# Patient Record
Sex: Male | Born: 1985 | Race: Black or African American | Hispanic: No | Marital: Single | Smoking: Never smoker
Health system: Southern US, Community
[De-identification: ages and names within clinical notes are randomized; demographics above are authoritative.]

## PROBLEM LIST (undated history)

## (undated) HISTORY — PX: APPENDECTOMY: SHX54

## (undated) HISTORY — PX: CHOLECYSTECTOMY: SHX55

---

## 2014-12-12 ENCOUNTER — Encounter (HOSPITAL_BASED_OUTPATIENT_CLINIC_OR_DEPARTMENT_OTHER): Payer: Self-pay | Admitting: Emergency Medicine

## 2014-12-12 ENCOUNTER — Emergency Department (HOSPITAL_BASED_OUTPATIENT_CLINIC_OR_DEPARTMENT_OTHER)
Admission: EM | Admit: 2014-12-12 | Discharge: 2014-12-12 | Disposition: A | Discharge status: Home / Self Care | Payer: Self-pay | Attending: Emergency Medicine | Admitting: Emergency Medicine

## 2014-12-12 ENCOUNTER — Emergency Department (HOSPITAL_BASED_OUTPATIENT_CLINIC_OR_DEPARTMENT_OTHER): Payer: Self-pay

## 2014-12-12 DIAGNOSIS — N23 Unspecified renal colic: Secondary | ICD-10-CM | POA: Insufficient documentation

## 2014-12-12 LAB — CBC WITH DIFFERENTIAL/PLATELET
BASOS ABS: 0 10*3/uL (ref 0.0–0.1)
Basophils Relative: 1 % (ref 0–1)
EOS PCT: 2 % (ref 0–5)
Eosinophils Absolute: 0.1 10*3/uL (ref 0.0–0.7)
HEMATOCRIT: 45.5 % (ref 39.0–52.0)
HEMOGLOBIN: 15.1 g/dL (ref 13.0–17.0)
Lymphocytes Relative: 38 % (ref 12–46)
Lymphs Abs: 1.6 10*3/uL (ref 0.7–4.0)
MCH: 28.2 pg (ref 26.0–34.0)
MCHC: 33.2 g/dL (ref 30.0–36.0)
MCV: 84.9 fL (ref 78.0–100.0)
Monocytes Absolute: 0.4 10*3/uL (ref 0.1–1.0)
Monocytes Relative: 10 % (ref 3–12)
NEUTROS ABS: 2.1 10*3/uL (ref 1.7–7.7)
Neutrophils Relative %: 49 % (ref 43–77)
Platelets: 254 10*3/uL (ref 150–400)
RBC: 5.36 MIL/uL (ref 4.22–5.81)
RDW: 12.3 % (ref 11.5–15.5)
WBC: 4.3 10*3/uL (ref 4.0–10.5)

## 2014-12-12 LAB — URINALYSIS, ROUTINE W REFLEX MICROSCOPIC
Bilirubin Urine: NEGATIVE
Glucose, UA: NEGATIVE mg/dL
Ketones, ur: NEGATIVE mg/dL
Leukocytes, UA: NEGATIVE
NITRITE: NEGATIVE
Protein, ur: NEGATIVE mg/dL
SPECIFIC GRAVITY, URINE: 1.025 (ref 1.005–1.030)
Urobilinogen, UA: 0.2 mg/dL (ref 0.0–1.0)
pH: 6 (ref 5.0–8.0)

## 2014-12-12 LAB — BASIC METABOLIC PANEL
Anion gap: 8 (ref 5–15)
BUN: 17 mg/dL (ref 6–20)
CO2: 28 mmol/L (ref 22–32)
CREATININE: 1.22 mg/dL (ref 0.61–1.24)
Calcium: 9.4 mg/dL (ref 8.9–10.3)
Chloride: 104 mmol/L (ref 101–111)
GFR calc Af Amer: >60 mL/min (ref >60)
GFR calc non Af Amer: >60 mL/min (ref >60)
Glucose, Bld: 96 mg/dL (ref 65–99)
POTASSIUM: 4.3 mmol/L (ref 3.5–5.1)
Sodium: 140 mmol/L (ref 135–145)

## 2014-12-12 LAB — URINE MICROSCOPIC-ADD ON

## 2014-12-12 MED ORDER — KETOROLAC TROMETHAMINE 30 MG/ML IJ SOLN
30.0000 mg | Freq: Once | INTRAMUSCULAR | Status: AC
  Administered 2014-12-12: 30 mg via INTRAVENOUS
  Filled 2014-12-12: qty 1

## 2014-12-12 MED ORDER — OXYCODONE-ACETAMINOPHEN 5-325 MG PO TABS
1.0000 | ORAL_TABLET | Freq: Once | ORAL | Status: AC
  Administered 2014-12-12: 1 via ORAL
  Filled 2014-12-12: qty 1

## 2014-12-12 MED ORDER — SODIUM CHLORIDE 0.9 % IV BOLUS (SEPSIS)
1000.0000 mL | Freq: Once | INTRAVENOUS | Status: AC
  Administered 2014-12-12: 1000 mL via INTRAVENOUS

## 2014-12-12 MED ORDER — HYDROMORPHONE HCL 1 MG/ML IJ SOLN
1.0000 mg | Freq: Once | INTRAMUSCULAR | Status: AC
  Administered 2014-12-12: 1 mg via INTRAVENOUS
  Filled 2014-12-12: qty 1

## 2014-12-12 MED ORDER — OXYCODONE-ACETAMINOPHEN 5-325 MG PO TABS: 1.0000 | ORAL_TABLET | Freq: Four times a day (QID) | ORAL | Status: DC | PRN

## 2014-12-12 MED ORDER — SODIUM CHLORIDE 0.9 % IV SOLN
Freq: Once | INTRAVENOUS | Status: AC
  Administered 2014-12-12: 1000 mL via INTRAVENOUS

## 2014-12-12 MED ORDER — ONDANSETRON HCL 4 MG/2ML IJ SOLN
4.0000 mg | Freq: Once | INTRAMUSCULAR | Status: AC
  Administered 2014-12-12: 4 mg via INTRAVENOUS
  Filled 2014-12-12: qty 2

## 2014-12-12 NOTE — ED Provider Notes (Signed)
CSN: [id number]     Arrival date & time 12/12/14  0750 History   First MD Initiated Contact with Patient 12/12/14 0759     No chief complaint on file.    (Consider location/radiation/quality/duration/timing/severity/associated sxs/prior Treatment) Patient is a 29 y.o. male presenting with abdominal pain.  Abdominal Pain Pain location:  R flank and RLQ Pain quality: aching and sharp   Pain radiates to:  Does not radiate Pain severity:  Severe Onset quality:  Gradual Duration:  4 days Timing:  Constant Progression:  Worsening Chronicity:  Recurrent Context comment:  During urination  Relieved by:  Nothing Worsened by:  Urination Associated symptoms: chills, dysuria, nausea and vomiting   Associated symptoms: no hematuria   Associated symptoms comment:  Urinary hesitancy.    No past medical history on file. No past surgical history on file. No family history on file. Social History  Substance Use Topics  . Smoking status: Not on file  . Smokeless tobacco: Not on file  . Alcohol Use: Not on file    Review of Systems  Constitutional: Positive for chills.  Gastrointestinal: Positive for nausea, vomiting and abdominal pain.  Genitourinary: Positive for dysuria. Negative for hematuria.  All other systems reviewed and are negative.     Allergies  Review of patient's allergies indicates not on file.  Home Medications   Prior to Admission medications   Not on File   BP 145/86 mmHg  Pulse 78  Temp(Src) 98.1 F (36.7 C) (Oral)  Resp 16  SpO2 100% Physical Exam  Constitutional: He is oriented to person, place, and time. He appears well-developed and well-nourished. No distress.  HENT:  Head: Normocephalic and atraumatic.  Mouth/Throat: Oropharynx is clear and moist.  Eyes: Conjunctivae are normal. Pupils are equal, round, and reactive to light. No scleral icterus.  Neck: Neck supple.  Cardiovascular: Normal rate, regular rhythm, normal heart sounds and intact  distal pulses.   No murmur heard. Pulmonary/Chest: Effort normal and breath sounds normal. No stridor. No respiratory distress. He has no wheezes. He has no rales.  Abdominal: Soft. He exhibits no distension. There is tenderness in the right lower quadrant. There is CVA tenderness (right). There is no rigidity, no rebound and no guarding. Hernia confirmed negative in the right inguinal area and confirmed negative in the left inguinal area.  Previous appendectomy and cholecystectomy scars  Genitourinary: Penis normal. Right testis shows no mass and no tenderness. Left testis shows no mass and no tenderness. Circumcised. No discharge found.  Musculoskeletal: Normal range of motion. He exhibits no edema.  Neurological: He is alert and oriented to person, place, and time.  Skin: Skin is warm and dry. No rash noted.  Psychiatric: He has a normal mood and affect. His behavior is normal.  Nursing note and vitals reviewed.   ED Course  Procedures (including critical care time) Labs Review Labs Reviewed  URINALYSIS, ROUTINE W REFLEX MICROSCOPIC (NOT AT [facility]) - Abnormal; Notable for the following:    Hgb urine dipstick LARGE (*)    All other components within normal limits  URINE MICROSCOPIC-ADD ON - Abnormal; Notable for the following:    Bacteria, UA FEW (*)    All other components within normal limits  URINE CULTURE  CBC WITH DIFFERENTIAL/PLATELET  BASIC METABOLIC PANEL    Imaging Review US Renal  12/12/2014   CLINICAL DATA:  Right flank pain for 2 days. History of kidney stones.  EXAM: RENAL / URINARY TRACT ULTRASOUND COMPLETE  COMPARISON:  None.  FINDINGS: Right Kidney:  Length: 12.2 cm. No mass identified. Echogenic foci in measure 3 mm in the renal pelvis and 4 mm more laterally in the interpolar/upper pole in may reflect small stones. The renal pelvis/ UPJ measures 7 mm in diameter. There is no caliceal dilatation.  Left Kidney:  Length: 10.5 cm. Echogenicity within normal limits. No mass  or hydronephrosis visualized.  Bladder:  Appears normal for degree of bladder distention.  IMPRESSION: 1. Two small echogenic foci in the right kidney measuring 3-4 mm and possibly reflecting small stones. Minimal prominence of the right renal pelvis without hydronephrosis. 2. Unremarkable appearance of the left kidney.   Electronically Signed   By: Sebastian Ache   On: 12/12/2014 10:56     EKG Interpretation None      MDM   Final diagnoses:  Ureteral colic    Right flank pain.  History of kidney stones.  IV dilaudid and zofran for symptom control.  [place], UA, and blood work.   UA and ultrasound suggest ureteral stone. Will treat as such. Pain controlled. Advised urology follow-up.  Blake Divine, MD 12/12/14 [phone]

## 2014-12-12 NOTE — ED Notes (Signed)
Pain rt flank

## 2014-12-12 NOTE — Discharge Instructions (Signed)

## 2014-12-14 LAB — URINE CULTURE

## 2014-12-19 ENCOUNTER — Emergency Department (HOSPITAL_BASED_OUTPATIENT_CLINIC_OR_DEPARTMENT_OTHER)
Admission: EM | Admit: 2014-12-19 | Discharge: 2014-12-19 | Disposition: A | Discharge status: Home / Self Care | Payer: Self-pay | Attending: Emergency Medicine | Admitting: Emergency Medicine

## 2014-12-19 ENCOUNTER — Encounter (HOSPITAL_BASED_OUTPATIENT_CLINIC_OR_DEPARTMENT_OTHER): Payer: Self-pay | Admitting: Emergency Medicine

## 2014-12-19 DIAGNOSIS — Z9049 Acquired absence of other specified parts of digestive tract: Secondary | ICD-10-CM | POA: Insufficient documentation

## 2014-12-19 DIAGNOSIS — N23 Unspecified renal colic: Secondary | ICD-10-CM | POA: Insufficient documentation

## 2014-12-19 DIAGNOSIS — R110 Nausea: Secondary | ICD-10-CM | POA: Insufficient documentation

## 2014-12-19 LAB — URINALYSIS, ROUTINE W REFLEX MICROSCOPIC
BILIRUBIN URINE: NEGATIVE
Glucose, UA: NEGATIVE mg/dL
KETONES UR: NEGATIVE mg/dL
Leukocytes, UA: NEGATIVE
NITRITE: NEGATIVE
Protein, ur: NEGATIVE mg/dL
Specific Gravity, Urine: 1.021 (ref 1.005–1.030)
UROBILINOGEN UA: 0.2 mg/dL (ref 0.0–1.0)
pH: 7 (ref 5.0–8.0)

## 2014-12-19 LAB — BASIC METABOLIC PANEL
Anion gap: 12 (ref 5–15)
BUN: 12 mg/dL (ref 6–20)
CALCIUM: 9.8 mg/dL (ref 8.9–10.3)
CO2: 25 mmol/L (ref 22–32)
Chloride: 105 mmol/L (ref 101–111)
Creatinine, Ser: 1.06 mg/dL (ref 0.61–1.24)
GFR calc Af Amer: >60 mL/min (ref >60)
GFR calc non Af Amer: >60 mL/min (ref >60)
Glucose, Bld: 116 mg/dL — ABNORMAL HIGH (ref 65–99)
Potassium: 4.1 mmol/L (ref 3.5–5.1)
SODIUM: 142 mmol/L (ref 135–145)

## 2014-12-19 LAB — URINE MICROSCOPIC-ADD ON

## 2014-12-19 MED ORDER — PROMETHAZINE HCL 25 MG PO TABS: 25.0000 mg | ORAL_TABLET | Freq: Four times a day (QID) | ORAL | Status: AC | PRN

## 2014-12-19 MED ORDER — PROMETHAZINE HCL 25 MG/ML IJ SOLN
12.5000 mg | Freq: Once | INTRAMUSCULAR | Status: AC
  Administered 2014-12-19: 12.5 mg via INTRAVENOUS
  Filled 2014-12-19: qty 1

## 2014-12-19 MED ORDER — SODIUM CHLORIDE 0.9 % IV BOLUS (SEPSIS)
1000.0000 mL | Freq: Once | INTRAVENOUS | Status: AC
  Administered 2014-12-19: 1000 mL via INTRAVENOUS

## 2014-12-19 MED ORDER — OXYCODONE-ACETAMINOPHEN 5-325 MG PO TABS: 2.0000 | ORAL_TABLET | ORAL | Status: AC | PRN

## 2014-12-19 MED ORDER — TAMSULOSIN HCL 0.4 MG PO CAPS
0.4000 mg | ORAL_CAPSULE | Freq: Once | ORAL | Status: AC
  Administered 2014-12-19: 0.4 mg via ORAL
  Filled 2014-12-19: qty 1

## 2014-12-19 MED ORDER — HYDROMORPHONE HCL 1 MG/ML IJ SOLN
1.0000 mg | INTRAMUSCULAR | Status: AC | PRN
  Administered 2014-12-19 (×2): 1 mg via INTRAVENOUS
  Filled 2014-12-19 (×2): qty 1

## 2014-12-19 MED ORDER — TAMSULOSIN HCL 0.4 MG PO CAPS: 0.4000 mg | ORAL_CAPSULE | Freq: Every day | ORAL | Status: AC

## 2014-12-19 MED ORDER — SODIUM CHLORIDE 0.9 % IV SOLN
INTRAVENOUS | Status: DC
Start: 2014-12-19 — End: 2014-12-19
  Administered 2014-12-19: 500 mL via INTRAVENOUS

## 2014-12-19 NOTE — ED Provider Notes (Signed)
CSN: [id number]     Arrival date & time 12/19/14  4098 History   First MD Initiated Contact with Patient 12/19/14 [phone]     Chief Complaint  Patient presents with  . Groin Pain     HPI  Patient presents evaluation of right flank pain. Seen and evaluated here 7 days ago. Had an ultrasound showing right proximal dilatation of the collecting system and hematuria. Takewith ureteral colic. He was doing well. Has 1 day left of his pain medicine. Infection not take any yesterday. He waking this morning with recurrence of his pain is in a different location is right lower abdomen radiating to his right testicle and he presents here. Also states that a few episodes of emesis this morning as well.  No past medical history on file. Past Surgical History  Procedure Laterality Date  . Cholecystectomy    . Appendectomy     No family history on file. Social History  Substance Use Topics  . Smoking status: Never Smoker   . Smokeless tobacco: None  . Alcohol Use: None    Review of Systems  Constitutional: Negative for fever, chills, diaphoresis, appetite change and fatigue.  HENT: Negative for mouth sores, sore throat and trouble swallowing.   Eyes: Negative for visual disturbance.  Respiratory: Negative for cough, chest tightness, shortness of breath and wheezing.   Cardiovascular: Negative for chest pain.  Gastrointestinal: Positive for nausea and abdominal pain. Negative for vomiting, diarrhea and abdominal distention.  Endocrine: Negative for polydipsia, polyphagia and polyuria.  Genitourinary: Positive for testicular pain. Negative for dysuria, frequency and hematuria.  Musculoskeletal: Negative for gait problem.  Skin: Negative for color change, pallor and rash.  Neurological: Negative for dizziness, syncope, light-headedness and headaches.  Hematological: Does not bruise/bleed easily.  Psychiatric/Behavioral: Negative for behavioral problems and confusion.      Allergies  Morphine  and related; Toradol; and Zofran  Home Medications   Prior to Admission medications   Medication Sig Start Date End Date Taking? Authorizing Provider  oxyCODONE-acetaminophen (PERCOCET/ROXICET) 5-325 MG per tablet Take 2 tablets by mouth every 4 (four) hours as needed. 12/19/14   Rolland Porter, MD  promethazine (PHENERGAN) 25 MG tablet Take 1 tablet (25 mg total) by mouth every 6 (six) hours as needed for nausea or vomiting. 12/19/14   Rolland Porter, MD  tamsulosin (FLOMAX) 0.4 MG CAPS capsule Take 1 capsule (0.4 mg total) by mouth daily. 12/19/14   Rolland Porter, MD   BP 127/63 mmHg  Pulse 77  Temp(Src) 98.4 F (36.9 C) (Oral)  Resp 16  Ht  (1.905 m)  Wt 200 lb (90.719 kg)  BMI 25.00 kg/m2  SpO2 100% Physical Exam  Constitutional: He is oriented to person, place, and time. He appears well-developed and well-nourished. No distress.  HENT:  Head: Normocephalic.  Eyes: Conjunctivae are normal. Pupils are equal, round, and reactive to light. No scleral icterus.  Neck: Normal range of motion. Neck supple. No thyromegaly present.  Cardiovascular: Normal rate and regular rhythm.  Exam reveals no gallop and no friction rub.   No murmur heard. Pulmonary/Chest: Effort normal and breath sounds normal. No respiratory distress. He has no wheezes. He has no rales.  Abdominal: Soft. Bowel sounds are normal. He exhibits no distension. There is no tenderness. There is no rebound.    Genitourinary:     Musculoskeletal: Normal range of motion.  Neurological: He is alert and oriented to person, place, and time.  Skin: Skin is warm and dry. No  rash noted.  Psychiatric: He has a normal mood and affect. His behavior is normal.    ED Course  Procedures (including critical care time) Labs Review Labs Reviewed  URINALYSIS, ROUTINE W REFLEX MICROSCOPIC (NOT AT [facility]) - Abnormal; Notable for the following:    Hgb urine dipstick LARGE (*)    All other components within normal limits  BASIC METABOLIC  PANEL - Abnormal; Notable for the following:    Glucose, Bld 116 (*)    All other components within normal limits  URINE MICROSCOPIC-ADD ON - Abnormal; Notable for the following:    Bacteria, UA FEW (*)    All other components within normal limits    Imaging Review No results found. I have personally reviewed and evaluated these images and lab results as part of my medical decision-making.   EKG Interpretation None      MDM   Final diagnoses:  Ureteral colic   Patient hydrated. Given IV pain medication. Symptoms improved. Has persistent microscopic hematuria. Normal testicular exam. Think his symptoms are consistent with a migrating stone. We discussed this at length. We discuss pros and cons of imaging. I do not feel this would offer any additional pertinent information on him as his symptoms are getting well controlled. Given prescriptions. Stated that if the symptoms persisted for greater than one more week with Vicodin to see urology to discuss possibility of extraction. His renal function remains intact. Afebrile. Does not appear infected. Plan is expectant management, and symptom control.    Rolland Porter, MD 12/19/14 [phone]

## 2014-12-19 NOTE — Discharge Instructions (Signed)
Your urine sample still shows blood consistent with the continued kidney stone. Your kidney function(blood test) remains normal. If you have not passed the stone or had relief of pain within the next 7 days' please follow-up with [facility] to discuss possible extraction of the stone. Return here with intolerable pain, vomiting, fevers, or other new symptoms.   Kidney Stones Kidney stones (urolithiasis) are deposits that form inside your kidneys. The intense pain is caused by the stone moving through the urinary tract. When the stone moves, the ureter goes into spasm around the stone. The stone is usually passed in the urine.  CAUSES   A disorder that makes certain neck glands produce too much parathyroid hormone (primary hyperparathyroidism).  A buildup of uric acid crystals, similar to gout in your joints.  Narrowing (stricture) of the ureter.  A kidney obstruction present at birth (congenital obstruction).  Previous surgery on the kidney or ureters.  Numerous kidney infections. SYMPTOMS   Feeling sick to your stomach (nauseous).  Throwing up (vomiting).  Blood in the urine (hematuria).  Pain that usually spreads (radiates) to the groin.  Frequency or urgency of urination. DIAGNOSIS   Taking a history and physical exam.  Blood or urine tests.  CT scan.  Occasionally, an examination of the inside of the urinary bladder (cystoscopy) is performed. TREATMENT   Observation.  Increasing your fluid intake.  Extracorporeal shock wave lithotripsy--This is a noninvasive procedure that uses shock waves to break up kidney stones.  Surgery may be needed if you have severe pain or persistent obstruction. There are various surgical procedures. Most of the procedures are performed with the use of small instruments. Only small incisions are needed to accommodate these instruments, so recovery time is minimized. The size, location, and chemical composition are all important  variables that will determine the proper choice of action for you. Talk to your health care provider to better understand your situation so that you will minimize the risk of injury to yourself and your kidney.  HOME CARE INSTRUCTIONS   Drink enough water and fluids to keep your urine clear or pale yellow. This will help you to pass the stone or stone fragments.  Strain all urine through the provided strainer. Keep all particulate matter and stones for your health care provider to see. The stone causing the pain may be as small as a grain of salt. It is very important to use the strainer each and every time you pass your urine. The collection of your stone will allow your health care provider to analyze it and verify that a stone has actually passed. The stone analysis will often identify what you can do to reduce the incidence of recurrences.  Only take over-the-counter or prescription medicines for pain, discomfort, or fever as directed by your health care provider.  Make a follow-up appointment with your health care provider as directed.  Get follow-up X-rays if required. The absence of pain does not always mean that the stone has passed. It may have only stopped moving. If the urine remains completely obstructed, it can cause loss of kidney function or even complete destruction of the kidney. It is your responsibility to make sure X-rays and follow-ups are completed. Ultrasounds of the kidney can show blockages and the status of the kidney. Ultrasounds are not associated with any radiation and can be performed easily in a matter of minutes. SEEK MEDICAL CARE IF:  You experience pain that is progressive and unresponsive to any pain medicine you  have been prescribed. SEEK IMMEDIATE MEDICAL CARE IF:   Pain cannot be controlled with the prescribed medicine.  You have a fever or shaking chills.  The severity or intensity of pain increases over 18 hours and is not relieved by pain medicine.  You  develop a new onset of abdominal pain.  You feel faint or pass out.  You are unable to urinate. MAKE SURE YOU:   Understand these instructions.  Will watch your condition.  Will get help right away if you are not doing well or get worse. Document Released: 04/19/2005 Document Revised: 12/20/2012 Document Reviewed: 09/20/2012 [facility] Patient Information 2015 [place], [place]. This information is not intended to replace advice given to you by your health care provider. Make sure you discuss any questions you have with your health care provider.

## 2014-12-19 NOTE — ED Notes (Signed)
Right groin pain, radiates to testicle.  No penile drainage, no fever.  No injury.  No heavy lifting.  Pain worsens after voiding.  No inflammation.

## 2016-06-10 IMAGING — US US RENAL
1 series · 14 of 25 positions shown · non-contrast
Comparison: None.

CLINICAL DATA: Right flank pain for 2 days. History of kidney
stones.

EXAM:
RENAL / URINARY TRACT ULTRASOUND COMPLETE

[Series 1: us renal · 0.20mm/px · 14 of 30 slices shown]
[im 1/30]
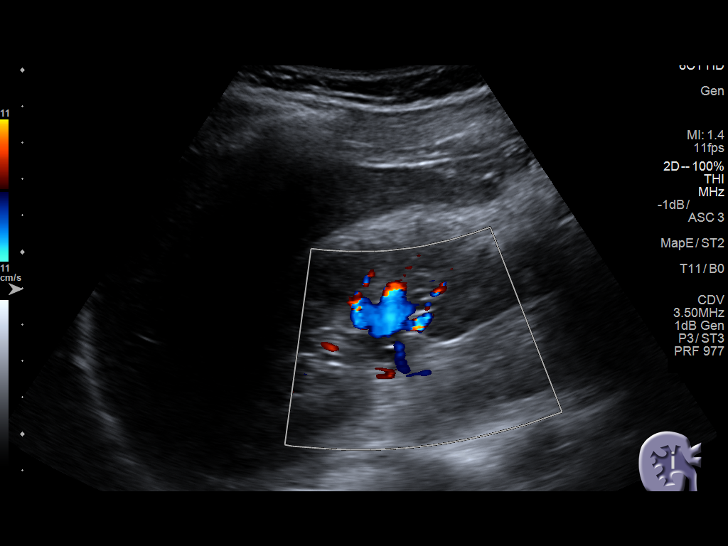
[im 3/30]
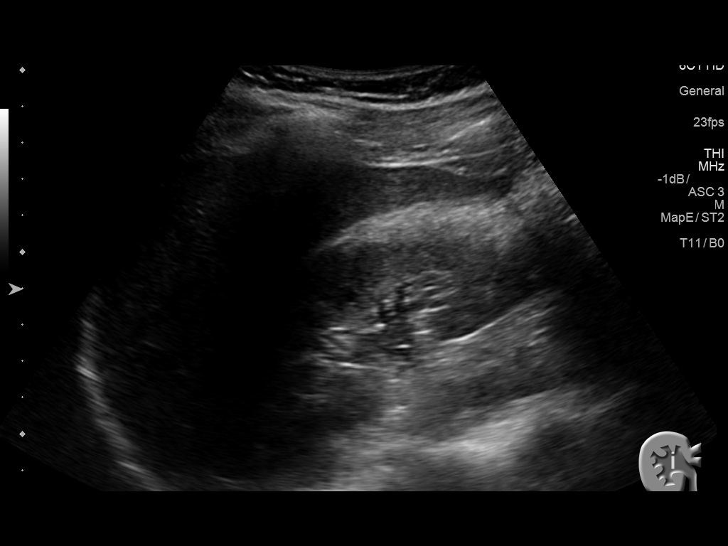
[im 5/30]
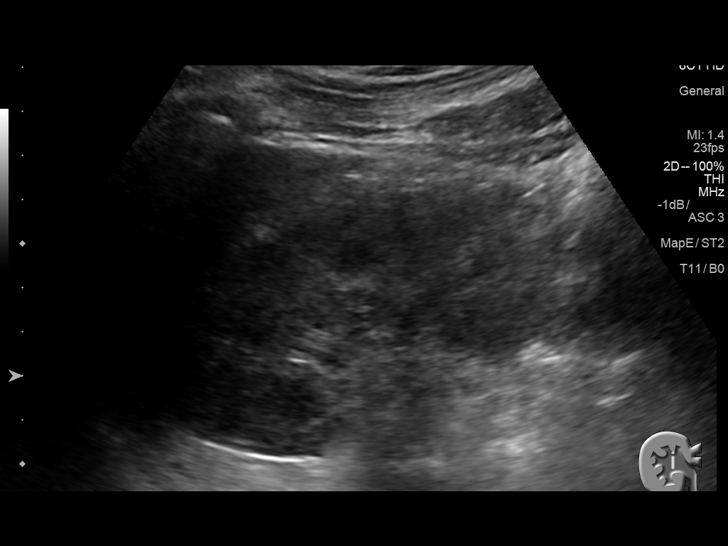
[im 8/30]
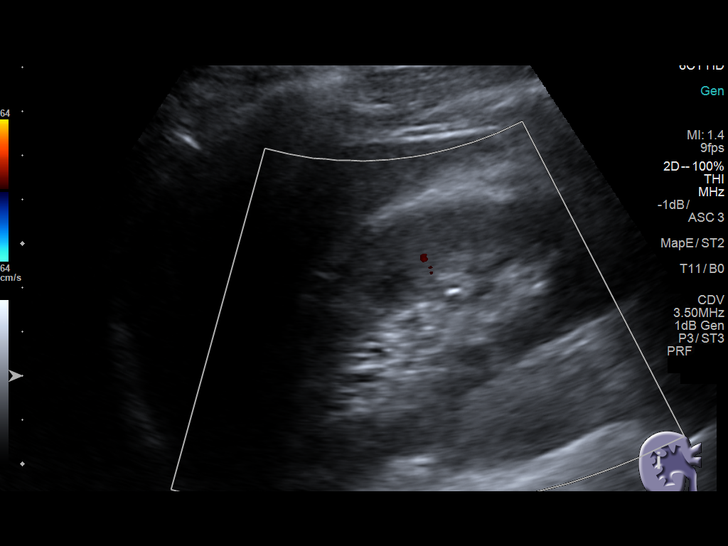
[im 10/30]
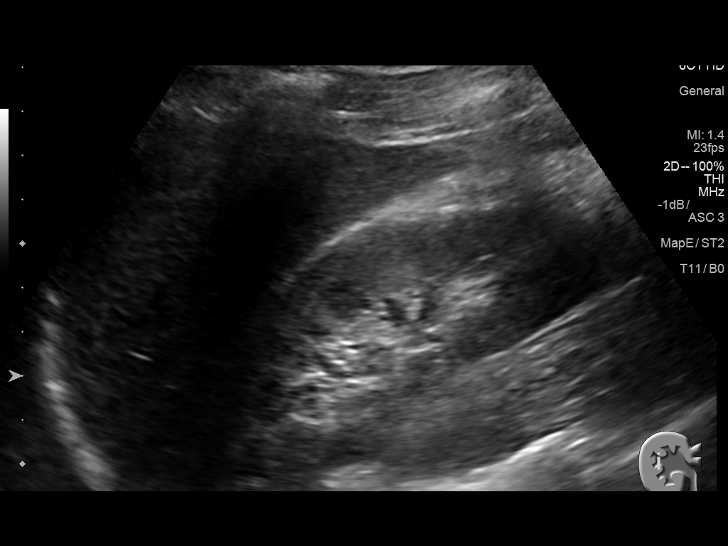
[im 11/30]
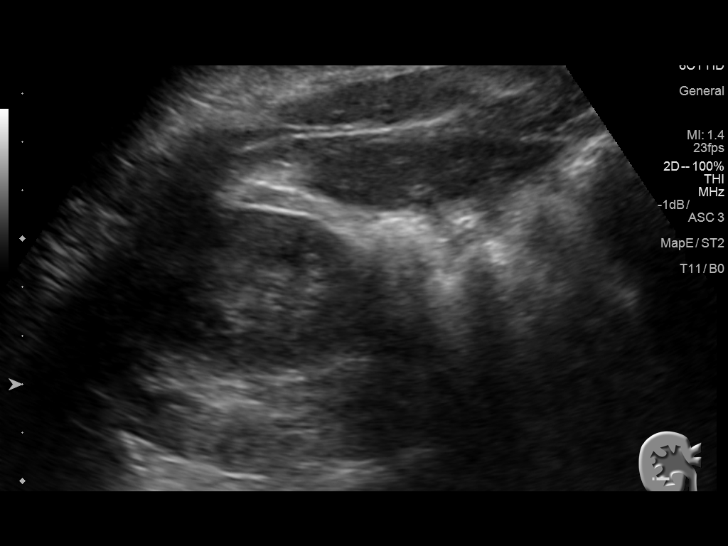
[im 14/30]
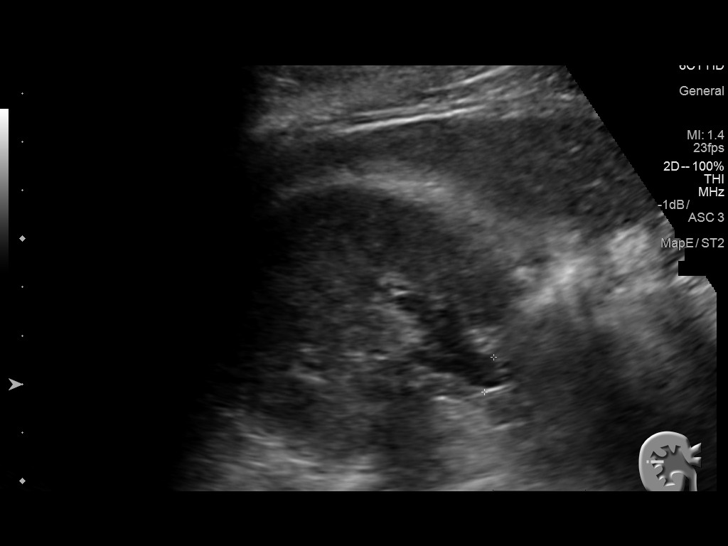
[im 16/30]
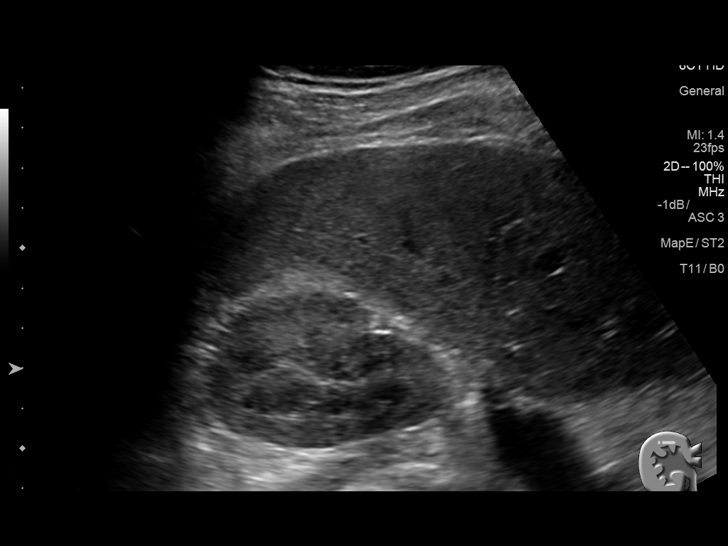
[im 19/30]
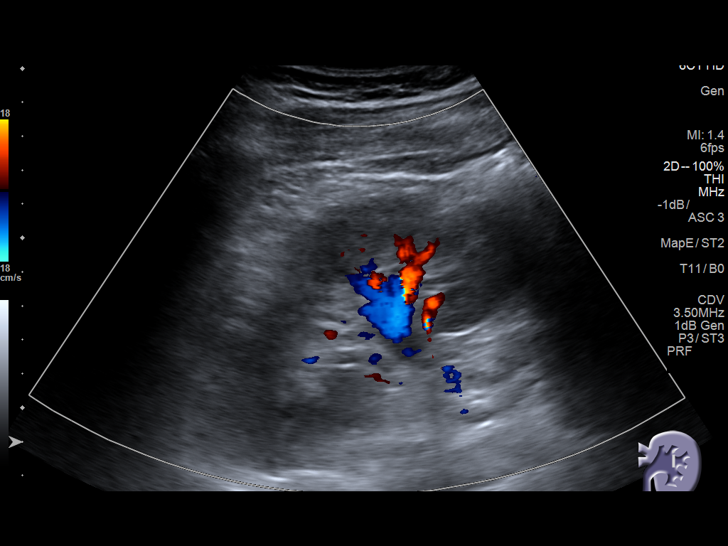
[im 20/30]
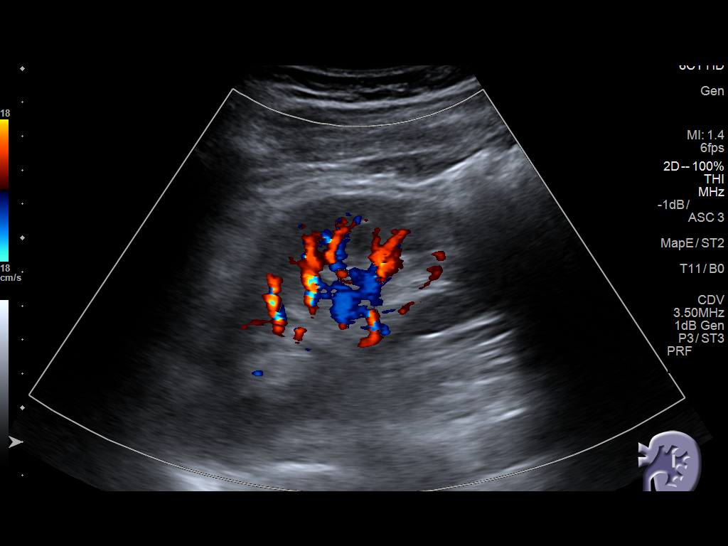
[im 22/30]
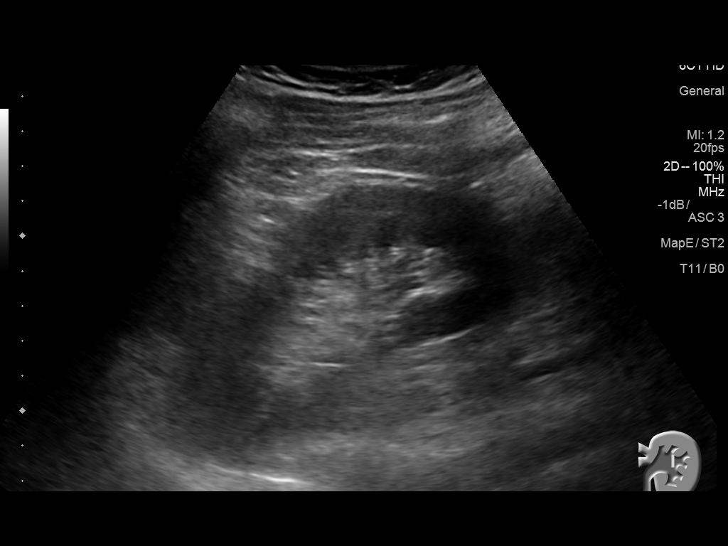
[im 25/30]
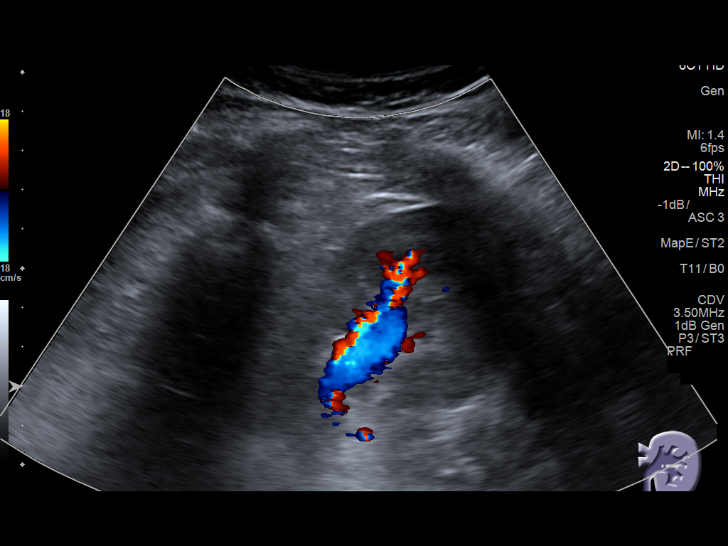
[im 27/30]
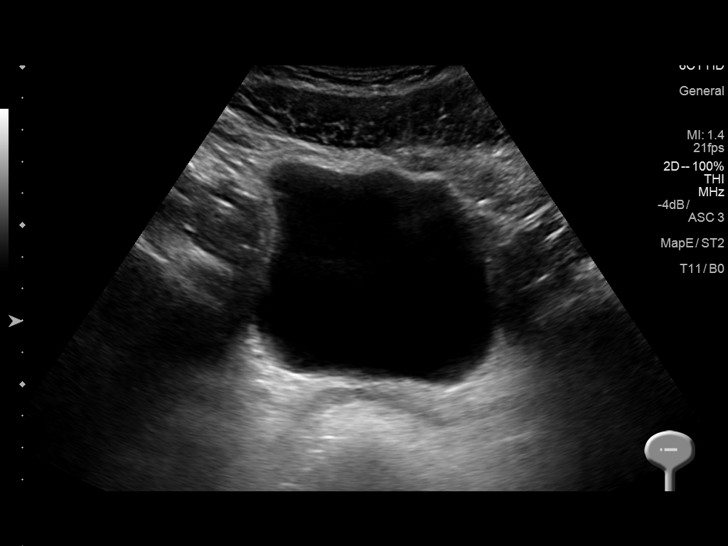
[im 30/30]
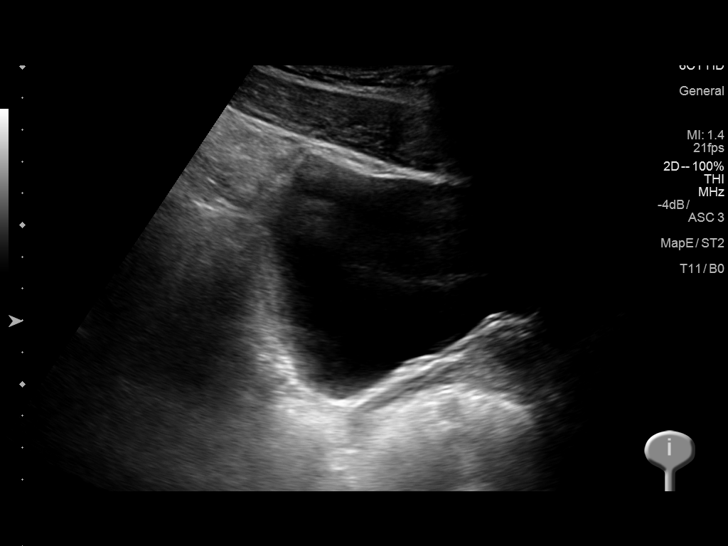

[14 of 25 positions shown; findings below may reference images not displayed]

FINDINGS: Right Kidney:

Length: 12.2 cm. No mass identified. Echogenic foci in measure 3 mm
in the renal pelvis and 4 mm more laterally in the interpolar/upper
pole [REDACTED] reflect small stones. The renal pelvis/ UPJ measures 7
mm in diameter. There is no caliceal dilatation.

Left Kidney:

Length: 10.5 cm. Echogenicity within normal limits. No mass or
hydronephrosis visualized.

Bladder:

Appears normal for degree of bladder distention.
IMPRESSION: 1. Two small echogenic foci in the right kidney measuring 3-4 mm and
possibly reflecting small stones. Minimal prominence of the right
renal pelvis without hydronephrosis.
2. Unremarkable appearance of the left kidney.
